# Patient Record
Sex: Male | Born: 1985 | Race: Black or African American | Hispanic: No | Marital: Single | Smoking: Current every day smoker
Health system: Southern US, Community
[De-identification: ages and names within clinical notes are randomized; demographics above are authoritative.]

## PROBLEM LIST (undated history)

## (undated) DIAGNOSIS — F410 Panic disorder [episodic paroxysmal anxiety] without agoraphobia: Secondary | ICD-10-CM

## (undated) DIAGNOSIS — G43909 Migraine, unspecified, not intractable, without status migrainosus: Secondary | ICD-10-CM

---

## 2005-01-12 ENCOUNTER — Inpatient Hospital Stay: Payer: Self-pay | Admitting: Psychiatry

## 2005-03-09 ENCOUNTER — Emergency Department: Payer: Self-pay | Admitting: Emergency Medicine

## 2005-04-03 ENCOUNTER — Emergency Department: Payer: Self-pay | Admitting: Emergency Medicine

## 2005-04-25 ENCOUNTER — Emergency Department: Payer: Self-pay | Admitting: Emergency Medicine

## 2005-08-01 ENCOUNTER — Emergency Department: Payer: Self-pay | Admitting: Emergency Medicine

## 2005-10-10 ENCOUNTER — Emergency Department: Payer: Self-pay | Admitting: Unknown Physician Specialty

## 2005-10-22 ENCOUNTER — Emergency Department: Payer: Self-pay | Admitting: Unknown Physician Specialty

## 2006-02-23 ENCOUNTER — Emergency Department: Payer: Self-pay | Admitting: Emergency Medicine

## 2006-12-19 ENCOUNTER — Emergency Department: Payer: Self-pay | Admitting: Emergency Medicine

## 2007-02-25 ENCOUNTER — Inpatient Hospital Stay: Payer: Self-pay | Admitting: Unknown Physician Specialty

## 2007-06-11 ENCOUNTER — Emergency Department: Payer: Self-pay | Admitting: Emergency Medicine

## 2007-07-13 ENCOUNTER — Emergency Department: Payer: Self-pay | Admitting: Emergency Medicine

## 2007-09-21 ENCOUNTER — Emergency Department: Payer: Self-pay | Admitting: Emergency Medicine

## 2008-05-22 ENCOUNTER — Emergency Department: Payer: Self-pay | Admitting: Emergency Medicine

## 2008-06-27 ENCOUNTER — Emergency Department: Payer: Self-pay | Admitting: Emergency Medicine

## 2008-06-29 ENCOUNTER — Inpatient Hospital Stay: Payer: Self-pay | Admitting: Unknown Physician Specialty

## 2008-08-07 ENCOUNTER — Emergency Department: Payer: Self-pay | Admitting: Emergency Medicine

## 2008-12-04 ENCOUNTER — Emergency Department: Payer: Self-pay | Admitting: Emergency Medicine

## 2008-12-05 ENCOUNTER — Emergency Department: Payer: Self-pay | Admitting: Emergency Medicine

## 2009-01-11 ENCOUNTER — Inpatient Hospital Stay: Payer: Self-pay | Admitting: Unknown Physician Specialty

## 2009-01-25 ENCOUNTER — Emergency Department: Payer: Self-pay | Admitting: Emergency Medicine

## 2009-02-26 ENCOUNTER — Inpatient Hospital Stay: Payer: Self-pay | Admitting: Psychiatry

## 2010-08-31 ENCOUNTER — Emergency Department: Payer: Self-pay | Admitting: Unknown Physician Specialty

## 2010-09-15 ENCOUNTER — Emergency Department: Payer: Self-pay | Admitting: Emergency Medicine

## 2010-09-16 ENCOUNTER — Ambulatory Visit: Payer: Self-pay | Admitting: Cardiovascular Disease

## 2011-04-08 ENCOUNTER — Emergency Department: Payer: Self-pay | Admitting: Internal Medicine

## 2011-05-24 ENCOUNTER — Emergency Department: Payer: Self-pay | Admitting: Emergency Medicine

## 2011-06-27 ENCOUNTER — Emergency Department: Payer: Self-pay | Admitting: Emergency Medicine

## 2011-07-09 ENCOUNTER — Emergency Department: Payer: Self-pay | Admitting: Emergency Medicine

## 2011-07-19 ENCOUNTER — Emergency Department: Payer: Self-pay | Admitting: Emergency Medicine

## 2013-01-12 ENCOUNTER — Emergency Department: Payer: Self-pay | Admitting: Emergency Medicine

## 2013-01-12 LAB — COMPREHENSIVE METABOLIC PANEL
Albumin: 4.5 g/dL (ref 3.4–5.0)
BUN: 12 mg/dL (ref 7–18)
Bilirubin,Total: 0.3 mg/dL (ref 0.2–1.0)
Chloride: 106 mmol/L (ref 98–107)
Creatinine: 1.02 mg/dL (ref 0.60–1.30)
EGFR (Non-African Amer.): >60
Potassium: 3.5 mmol/L (ref 3.5–5.1)
SGOT(AST): 47 U/L — ABNORMAL HIGH (ref 15–37)
SGPT (ALT): 38 U/L (ref 12–78)

## 2013-01-12 LAB — DRUG SCREEN, URINE
Barbiturates, Ur Screen: NEGATIVE (ref ?–200)
Cannabinoid 50 Ng, Ur ~~LOC~~: POSITIVE (ref ?–50)
MDMA (Ecstasy)Ur Screen: NEGATIVE (ref ?–500)
Methadone, Ur Screen: NEGATIVE (ref ?–300)
Opiate, Ur Screen: NEGATIVE (ref ?–300)
Phencyclidine (PCP) Ur S: NEGATIVE (ref ?–25)

## 2013-01-12 LAB — CBC
HGB: 15.3 g/dL (ref 13.0–18.0)
MCH: 27.6 pg (ref 26.0–34.0)
MCHC: 32.8 g/dL (ref 32.0–36.0)
MCV: 84 fL (ref 80–100)
RBC: 5.54 10*6/uL (ref 4.40–5.90)
RDW: 14.2 % (ref 11.5–14.5)
WBC: 7.7 10*3/uL (ref 3.8–10.6)

## 2013-01-12 LAB — ACETAMINOPHEN LEVEL: Acetaminophen: <2 ug/mL

## 2013-01-12 LAB — TSH: Thyroid Stimulating Horm: 2.14 u[IU]/mL

## 2013-01-12 LAB — ETHANOL
Ethanol %: 0.069 % (ref 0.000–0.080)
Ethanol: 69 mg/dL

## 2014-06-10 ENCOUNTER — Inpatient Hospital Stay: Payer: Self-pay | Admitting: Psychiatry

## 2014-06-10 LAB — URINALYSIS, COMPLETE
Bacteria: NONE SEEN
Bilirubin,UR: NEGATIVE
Blood: NEGATIVE
Glucose,UR: NEGATIVE mg/dL (ref 0–75)
Hyaline Cast: 81
Leukocyte Esterase: NEGATIVE
NITRITE: NEGATIVE
Ph: 5 (ref 4.5–8.0)
Protein: 100
SPECIFIC GRAVITY: 1.023 (ref 1.003–1.030)
SQUAMOUS EPITHELIAL: NONE SEEN

## 2014-06-10 LAB — CBC
HCT: 49.2 % (ref 40.0–52.0)
HGB: 15.4 g/dL (ref 13.0–18.0)
MCH: 26.5 pg (ref 26.0–34.0)
MCHC: 31.3 g/dL — AB (ref 32.0–36.0)
MCV: 85 fL (ref 80–100)
PLATELETS: 261 10*3/uL (ref 150–440)
RBC: 5.82 10*6/uL (ref 4.40–5.90)
RDW: 14.5 % (ref 11.5–14.5)
WBC: 4.7 10*3/uL (ref 3.8–10.6)

## 2014-06-10 LAB — COMPREHENSIVE METABOLIC PANEL
ALBUMIN: 4.1 g/dL (ref 3.4–5.0)
AST: 40 U/L — AB (ref 15–37)
Alkaline Phosphatase: 79 U/L
Anion Gap: 6 — ABNORMAL LOW (ref 7–16)
BILIRUBIN TOTAL: 0.6 mg/dL (ref 0.2–1.0)
BUN: 11 mg/dL (ref 7–18)
CO2: 25 mmol/L (ref 21–32)
Calcium, Total: 9.1 mg/dL (ref 8.5–10.1)
Chloride: 106 mmol/L (ref 98–107)
Creatinine: 1.31 mg/dL — ABNORMAL HIGH (ref 0.60–1.30)
EGFR (African American): >60
EGFR (Non-African Amer.): >60
Glucose: 122 mg/dL — ABNORMAL HIGH (ref 65–99)
Osmolality: 275 (ref 275–301)
POTASSIUM: 3.6 mmol/L (ref 3.5–5.1)
SGPT (ALT): 40 U/L (ref 12–78)
SODIUM: 137 mmol/L (ref 136–145)
Total Protein: 7.9 g/dL (ref 6.4–8.2)

## 2014-06-10 LAB — DRUG SCREEN, URINE
Amphetamines, Ur Screen: NEGATIVE (ref ?–1000)
BARBITURATES, UR SCREEN: NEGATIVE (ref ?–200)
Benzodiazepine, Ur Scrn: NEGATIVE (ref ?–200)
CANNABINOID 50 NG, UR ~~LOC~~: POSITIVE (ref ?–50)
COCAINE METABOLITE, UR ~~LOC~~: NEGATIVE (ref ?–300)
MDMA (Ecstasy)Ur Screen: NEGATIVE (ref ?–500)
METHADONE, UR SCREEN: NEGATIVE (ref ?–300)
OPIATE, UR SCREEN: NEGATIVE (ref ?–300)
PHENCYCLIDINE (PCP) UR S: NEGATIVE (ref ?–25)
Tricyclic, Ur Screen: NEGATIVE (ref ?–1000)

## 2014-06-10 LAB — ETHANOL
Ethanol %: <0.003 % (ref 0.000–0.080)
Ethanol: <3 mg/dL

## 2014-06-10 LAB — SALICYLATE LEVEL: SALICYLATES, SERUM: 2 mg/dL

## 2014-06-10 LAB — ACETAMINOPHEN LEVEL: Acetaminophen: <2 ug/mL

## 2014-10-11 LAB — CBC
HCT: 48.3 % (ref 40.0–52.0)
HGB: 15.4 g/dL (ref 13.0–18.0)
MCH: 27.1 pg (ref 26.0–34.0)
MCHC: 31.9 g/dL — AB (ref 32.0–36.0)
MCV: 85 fL (ref 80–100)
PLATELETS: 242 10*3/uL (ref 150–440)
RBC: 5.68 10*6/uL (ref 4.40–5.90)
RDW: 14.5 % (ref 11.5–14.5)
WBC: 5.3 10*3/uL (ref 3.8–10.6)

## 2014-10-11 LAB — SALICYLATE LEVEL: Salicylates, Serum: <1.7 mg/dL

## 2014-10-11 LAB — COMPREHENSIVE METABOLIC PANEL
ALK PHOS: 82 U/L
ANION GAP: 10 (ref 7–16)
Albumin: 3.8 g/dL (ref 3.4–5.0)
BILIRUBIN TOTAL: 0.3 mg/dL (ref 0.2–1.0)
BUN: 12 mg/dL (ref 7–18)
CHLORIDE: 111 mmol/L — AB (ref 98–107)
CO2: 23 mmol/L (ref 21–32)
Calcium, Total: 8 mg/dL — ABNORMAL LOW (ref 8.5–10.1)
Creatinine: 1.11 mg/dL (ref 0.60–1.30)
EGFR (African American): >60
EGFR (Non-African Amer.): >60
GLUCOSE: 135 mg/dL — AB (ref 65–99)
OSMOLALITY: 289 (ref 275–301)
POTASSIUM: 3.6 mmol/L (ref 3.5–5.1)
SGOT(AST): 36 U/L (ref 15–37)
SGPT (ALT): 38 U/L
Sodium: 144 mmol/L (ref 136–145)
Total Protein: 7.5 g/dL (ref 6.4–8.2)

## 2014-10-11 LAB — ACETAMINOPHEN LEVEL: ACETAMINOPHEN: 17 ug/mL

## 2014-10-11 LAB — ETHANOL: ETHANOL LVL: 70 mg/dL

## 2014-10-12 LAB — DRUG SCREEN, URINE
Amphetamines, Ur Screen: NEGATIVE (ref ?–1000)
BARBITURATES, UR SCREEN: NEGATIVE (ref ?–200)
Benzodiazepine, Ur Scrn: NEGATIVE (ref ?–200)
Cannabinoid 50 Ng, Ur ~~LOC~~: POSITIVE (ref ?–50)
Cocaine Metabolite,Ur ~~LOC~~: NEGATIVE (ref ?–300)
MDMA (Ecstasy)Ur Screen: NEGATIVE (ref ?–500)
METHADONE, UR SCREEN: NEGATIVE (ref ?–300)
Opiate, Ur Screen: NEGATIVE (ref ?–300)
PHENCYCLIDINE (PCP) UR S: NEGATIVE (ref ?–25)
TRICYCLIC, UR SCREEN: NEGATIVE (ref ?–1000)

## 2014-10-12 LAB — URINALYSIS, COMPLETE
BILIRUBIN, UR: NEGATIVE
Bacteria: NONE SEEN
Blood: NEGATIVE
GLUCOSE, UR: NEGATIVE mg/dL (ref 0–75)
Hyaline Cast: 3
Ketone: NEGATIVE
Leukocyte Esterase: NEGATIVE
Nitrite: NEGATIVE
PH: 5 (ref 4.5–8.0)
Protein: NEGATIVE
SPECIFIC GRAVITY: 1.025 (ref 1.003–1.030)
Squamous Epithelial: NONE SEEN
WBC UR: 2 /HPF (ref 0–5)

## 2014-10-13 ENCOUNTER — Inpatient Hospital Stay: Payer: Self-pay | Admitting: Psychiatry
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
## 2015-04-27 NOTE — H&P (Signed)
PATIENT NAME:  Dylan Cook, Stone L [id number] OF BIRTH:  05-Jul-1986 OF ASSESSMENT:  10/14/2014  FOR CONSULTATION: The patient is a 29 year old African American male with history of multiple psychiatric hospitalizations presented to the ER on IVC papers which were initiated by his father after he was having verbal altercation with his father.  "Me and my dad were arguing and he was getting in my face for no reason." OF PRESENT ILLNESS:  Per ER assessment: The patient reported that he was IVC' d by his father.  Reported that he currently lives with his parents.  The patient reported that he was passing by his father and did not talk to him.  His father became upset and they started arguing.  He started threatening his father and then the father also threatened him that he is going to shoot him. The patient reported that were verbally arguing and they did not have any physical alternations.  Alcohol level was 70 at arrival to ER. The patient reported that he was brought to the hospital because he has not been compliant with his medication and has stopped taking his medications almost a month ago. He reported that his car had broken down and he did not have a way to go to his outpatient appointments with Dr. Marguerite OleaMoffett.  He reported that he has history of schizoaffective disorder with history of violent outbursts in the past. He reported that he has not been sleeping well, as well. The patient currently denied having any suicidal ideations or plans. He reported that the police were quick in making him to the hospital, although his father also threatened him. He currently denied having any suicidal or homicidal ideations or plans.   the patient denies having problems with mood, appetite, energy or concentration.  He denies having SI, HI or A/V H. Denies having SE from medications and denies having any physical complaints. RHA: pt currently receiving CST.  Long h/o of anger issues, also the issues with his father are chronic.   Mother wants the pt to return to the house once ready for discharge. ABUSE HISTORY: The patient reported that he had been smoking marijuana over the weekend.  He reported that he does not drink alcohol on a regular basis.  He currently denied using illicit drugs, including cocaine and opiates.   PSYCHIATRIC HISTORY: The patient has long history of mental health issues and has multiple psychiatric hospitalizations. He was recently discharged in June 2015. He has history of aggressive behavior in the past and has been diagnosed with schizoaffective disorder and has been tried on several psychotropic medications, including Depakote, lithium and antipsychotics. He currently denied having any suicidal thoughts. reports he has been taking the same medications he was discharge on back in June.  Seroquel, klonopin and Depakote which he feels are helpful.  MEDICAL HISTORY: hypertension not on medications.  HISTORY: The patient has history of bipolar disorder.  HISTORY: The patient reported that he currently lives with his family. He has some pending legal charges related to domestic violence in the past. The patient reported that he does not have any steady relationship at this time.   MEDICATIONS: None reported as he has been noncompliant with his medication.   ALLERGIES: LITHIUM AND CLONAZEPAM. OF SYSTEMS:  Denies any fever or fatigue, weakness. No hearing loss, epistaxis discharge. No double or blurred vision. No cough, wheezing or hemoptysis. Denies any chest pain or orthopnea. No nausea, vomiting, diarrhea, abdominal pain. No dysuria or hematuria noted. No polydipsia, no CVA noted.  No anemia or easy bruising. No acne or rash. No numbness or weakness noted.   MENTAL STATUS EXAMINATION: aa male who appears his stated agepleasant and cooperativeactivity: mildly decreasedcontact: wnlregular tone, volume and rateprocess: linearcontent: neg for SI, HI, or A/V H. dysphoric (MILDLY)congruentfairfairexam:and  oriented times 4of knowledge is averageand concentration: grossly intact.    EXAM:SIGNS: Temperature 98.7, pulse 79, respirations 18, blood pressure 143/97.nl gait, nl motor tone and no evidence of involuntary movements. DATA:  Glucose 135, BUN 12, creatinine 1.11, sodium 144, potassium 3.6, chloride 111, anion gap 10, osmolality 289, calcium 8.0. Blood alcohol level 70. Protein 7.5, albumin 3.8, bilirubin 0.3, alkaline phosphatase 82, AST 36, ALT 38, urine drug screen is positive for cannabinoids.  WBC is 5.3, RBC 5.68, hemoglobin is 15.4, hematocrit is 48.3, MCV 85, RDW 14.5.   IMPRESSION: I: Schizoaffective disorder, bipolar type, Alcohol use disorder moderate, Cannabis use disorder severe.II: antisocial traits by history III: Hypertension 29 y/o male with h/o schizoaffective disorder who was brought in under petition due to having an argument and communicating threats to his father.  Pt had an alcohol level of 70.  He has not been compliant with psychotropics for the last month.  for aggression and non compliance d/oSeroquel 50 mg po q am and 100 mg po qhsDepakote 250 mg po bid d/c klonopin as pt has not used in 1 m.  elevated BP will monitor closely to see if need of adding antihypertensive. useevidence of withdrawalprior h/o complicated withdrawals planning:observe for 24 hlikely will d/c tomorrow to f/u with [facility] services   Electronic Signatures: Jimmy FootmanHernandez-Gonzalez, Lamika Connolly (MD)  (Signed on 28-Oct-15 13:33)  Authored  Last Updated: 28-Oct-15 13:33 by Jimmy FootmanHernandez-Gonzalez, Yaron Grasse (MD)

## 2017-09-13 ENCOUNTER — Emergency Department
Admission: EM | Admit: 2017-09-13 | Discharge: 2017-09-14 | Disposition: A | Discharge status: Home / Self Care | Payer: Medicare Other | Attending: Emergency Medicine | Admitting: Emergency Medicine

## 2017-09-13 DIAGNOSIS — T50901A Poisoning by unspecified drugs, medicaments and biological substances, accidental (unintentional), initial encounter: Secondary | ICD-10-CM

## 2017-09-13 DIAGNOSIS — T43201A Poisoning by unspecified antidepressants, accidental (unintentional), initial encounter: Secondary | ICD-10-CM | POA: Diagnosis not present

## 2017-09-14 DIAGNOSIS — T43201A Poisoning by unspecified antidepressants, accidental (unintentional), initial encounter: Secondary | ICD-10-CM | POA: Diagnosis not present

## 2017-09-14 LAB — COMPREHENSIVE METABOLIC PANEL
ALBUMIN: 4.3 g/dL (ref 3.5–5.0)
ALK PHOS: 70 U/L (ref 38–126)
ALT: 36 U/L (ref 17–63)
ANION GAP: 9 (ref 5–15)
AST: 35 U/L (ref 15–41)
BUN: 11 mg/dL (ref 6–20)
CALCIUM: 8.9 mg/dL (ref 8.9–10.3)
CHLORIDE: 106 mmol/L (ref 101–111)
CO2: 24 mmol/L (ref 22–32)
CREATININE: 1.21 mg/dL (ref 0.61–1.24)
GFR calc non Af Amer: >60 mL/min (ref >60)
GLUCOSE: 97 mg/dL (ref 65–99)
Potassium: 3.6 mmol/L (ref 3.5–5.1)
SODIUM: 139 mmol/L (ref 135–145)
Total Bilirubin: 0.6 mg/dL (ref 0.3–1.2)
Total Protein: 7.6 g/dL (ref 6.5–8.1)

## 2017-09-14 LAB — CBC
HCT: 45.6 % (ref 40.0–52.0)
HEMOGLOBIN: 15.4 g/dL (ref 13.0–18.0)
MCH: 27.6 pg (ref 26.0–34.0)
MCHC: 33.8 g/dL (ref 32.0–36.0)
MCV: 81.7 fL (ref 80.0–100.0)
Platelets: 258 10*3/uL (ref 150–440)
RBC: 5.58 MIL/uL (ref 4.40–5.90)
RDW: 14.5 % (ref 11.5–14.5)
WBC: 5.7 10*3/uL (ref 3.8–10.6)

## 2017-09-14 LAB — ACETAMINOPHEN LEVEL

## 2017-09-14 LAB — SALICYLATE LEVEL: Salicylate Lvl: <7 mg/dL (ref 2.8–30.0)

## 2017-09-14 LAB — ETHANOL

## 2017-09-14 MED ORDER — SODIUM CHLORIDE 0.9 % IV BOLUS (SEPSIS)
1000.0000 mL | Freq: Once | INTRAVENOUS | Status: AC
  Administered 2017-09-14: 1000 mL via INTRAVENOUS

## 2017-09-14 NOTE — ED Notes (Signed)
Resting quietly at this time.  Mother at bedside.

## 2017-09-14 NOTE — ED Provider Notes (Addendum)
[facility] Emergency Department Provider Note  ____________________________________________  Time seen: Approximately 12:30 AM  I have reviewed the triage vital signs and the nursing notes.   HISTORY  Chief Complaint Drug Overdose   HPI Dylan Cook is a 31 y.o. male the history of bipolar disorder who presents after an accidental overdose of amitriptyline. Patient reports that he was given a prescription of amitriptyline many years ago for his depression. The prescription is old and expired and hasn't used in several years. He has been having a lot of personal problems with his father and his partner for the last few weeks. He hasn't slept in 2 days and has been feeling more overwhelmed. He is suppose to go to work tomorrow at [employer] and needed to sleep so he took one 100 mg amitriptyline pill to helphim sleep and a few hours later he took another one. He tells me he forgot that he had taken one pill this evening and he panicked that he might have taken too much and called EMS. He also took two alcoholic drinks this evening. He smokes MJ but denies other drugs. Denies prior h/o suicide attempt.  patient denies suicidal ideation. He denies homicidal ideation. Denies hallucinations. He denies chest pain, shortness of breath, nausea, vomiting, palpitations, diarrhea, fever or chills.  No past medical history on file.  There are no active problems to display for this patient.   No past surgical history on file.  Prior to Admission medications   Not on File    Allergies Klonopin [clonazepam] and Lithium  No family history on file.  Social History Social History  Substance Use Topics  . Smoking status: Not on file  . Smokeless tobacco: Not on file  . Alcohol use Not on file    Review of Systems  Constitutional: Negative for fever. Eyes: Negative for visual changes. ENT: Negative for sore throat. Neck: No neck pain  Cardiovascular: Negative  for chest pain. Respiratory: Negative for shortness of breath. Gastrointestinal: Negative for abdominal pain, vomiting or diarrhea. Genitourinary: Negative for dysuria. Musculoskeletal: Negative for back pain. Skin: Negative for rash. Neurological: Negative for headaches, weakness or numbness. Psych: No SI or HI. + depression  ____________________________________________   PHYSICAL EXAM:  VITAL SIGNS: ED Triage Vitals  Enc Vitals Group     BP 09/13/17 2358 (!) 161/93     Pulse Rate 09/13/17 2358 (!) 102     Resp 09/13/17 2358 20     Temp 09/13/17 2358 98 F (36.7 C)     Temp Source 09/13/17 2358 Oral     SpO2 09/13/17 2358 96 %     Weight 09/13/17 2359 245 lb (111.1 kg)     Height 09/13/17 2359  (1.753 m)     Head Circumference --      Peak Flow --      Pain Score --      Pain Loc --      Pain Edu? --      Excl. in GC? --     Constitutional: Alert and oriented. Well appearing and in no apparent distress. HEENT:      Head: Normocephalic and atraumatic.         Eyes: Conjunctivae are normal. Sclera is non-icteric.       Mouth/Throat: Mucous membranes are moist.       Neck: Supple with no signs of meningismus. Cardiovascular: Tachycardic with regular rhythm. No murmurs, gallops, or rubs. 2+ symmetrical distal pulses are  present in all extremities. No JVD. Respiratory: Normal respiratory effort. Lungs are clear to auscultation bilaterally. No wheezes, crackles, or rhonchi.  Gastrointestinal: Soft, non tender, and non distended with positive bowel sounds. No rebound or guarding. Genitourinary: No CVA tenderness. Musculoskeletal: Nontender with normal range of motion in all extremities. No edema, cyanosis, or erythema of extremities. Neurologic: Normal speech and language. Face is symmetric. Moving all extremities. No gross focal neurologic deficits are appreciated. Skin: Skin is warm, dry and intact. No rash noted. Psychiatric: Mood and affect are normal. Speech and  behavior are normal.  ____________________________________________   LABS (all labs ordered are listed, but only abnormal results are displayed)  Labs Reviewed  ACETAMINOPHEN LEVEL - Abnormal; Notable for the following:       Result Value   Acetaminophen (Tylenol), Serum <10 (*)    All other components within normal limits  CBC  COMPREHENSIVE METABOLIC PANEL  SALICYLATE LEVEL  ETHANOL  URINE DRUG SCREEN, QUALITATIVE ([facility] ONLY)   ____________________________________________  EKG  ED ECG REPORT I, Nita Sickle, the attending physician, personally viewed and interpreted this ECG.  Sinus tachycardia, rate of 102, normal intervals, normal axis, no ST elevations or depressions. ____________________________________________  RADIOLOGY  none  ____________________________________________   PROCEDURES  Procedure(s) performed: None Procedures Critical Care performed:  None ____________________________________________   INITIAL IMPRESSION / ASSESSMENT AND PLAN / ED COURSE   31 y.o. male the history of bipolar disorder who presents after an accidental overdose of amitriptyline after taking 2 x  amitriptyline pills this evening. Patient has no complaints at this time. Slightly tachycardic but otherwise normal vital signs. We'll do EKG, blood work, salicylate and Tylenol level, drug screen, ethanol level. Will monitor on telemetry and consult Poison Control Center. I offered a psych evaluation for patient for help with his depression and insomnia but patient wishes medical clearance and dc since he has to work tomorrow. Patient does not meet Criteria for involuntary commitment since this was an intentional ingestion with no suicidality.  Clinical Course as of Sep 14 201  Fri Sep 14, 2017  0158 discussed with [facility] who recommended discharge home after blood work was done. No need for observation since patient's total dose of medicine is within therapeutic  range and not considered an overdose. Labs with no acute findings. EKG unchanged from prior. Patient received  IV fluids. patient remains well appearing and is going to be discharged home at this time.  [CV]    Clinical Course User Index [CV] Don Perking [place], MD    _________________________ 2:04 AM on 09/14/2017 -----------------------------------------  outpatient mental health resources provided to patient and his mother who is at the bedside. Both comfortable going home. Mother has no concerns about patient's safety  Pertinent labs & imaging results that were available during my care of the patient were reviewed by me and considered in my medical decision making (see chart for details).    ____________________________________________   FINAL CLINICAL IMPRESSION(S) / ED DIAGNOSES  Final diagnoses:  Accidental drug overdose, initial encounter      NEW MEDICATIONS STARTED DURING THIS VISIT:  New Prescriptions   No medications on file     Note:  This document was prepared using Dragon voice recognition software and may include unintentional dictation errors.    Don Perking, [place], MD 09/14/17 Therisa Doyne    Don Perking, [place], MD 09/14/17 [phone]

## 2017-09-14 NOTE — ED Triage Notes (Signed)
Patient to [facility] 2 via EMS from home after taking extra dose amitriptyline tonight.  Patient reports that the prescription is 32 years old and that he forgot he had taken one tablet then took another.  Patient reports that is has been having a lot of stress lately, arguing with his parents and girlfriend.  Patient reports he has not slept in 2 days.  Patient also reports having suicidal thoughts, but states "I would never act on it".  Patient contracts for safety.

## 2017-09-14 NOTE — Discharge Instructions (Signed)
You have been seen in the Emergency Department (ED)  today for a psychiatric complaint.  You have been evaluated by psychiatry and we believe you are safe to be discharged from the hospital.   ° °Please return to the Emergency Department (ED)  immediately if you have ANY thoughts of hurting yourself or anyone else, so that we may help you. ° °Please avoid alcohol and drug use. ° °Follow up with your doctor and/or therapist as soon as possible regarding today's ED  visit.  ° °You may call crisis hotline for [place] at [phone]. ° °

## 2018-05-15 ENCOUNTER — Other Ambulatory Visit: Payer: Self-pay

## 2018-05-15 ENCOUNTER — Emergency Department: Payer: Medicare Other

## 2018-05-15 DIAGNOSIS — G43809 Other migraine, not intractable, without status migrainosus: Secondary | ICD-10-CM | POA: Insufficient documentation

## 2018-05-15 DIAGNOSIS — F17200 Nicotine dependence, unspecified, uncomplicated: Secondary | ICD-10-CM | POA: Insufficient documentation

## 2018-05-15 DIAGNOSIS — R51 Headache: Secondary | ICD-10-CM | POA: Diagnosis present

## 2018-05-15 NOTE — ED Triage Notes (Addendum)
Pt arrives to ED via POV from home with c/o headache x 2hrs. Pt reports smoking marijuana prior to headache starting, with h/x of migraines. No reports of N/V; no SHOB or CP. No visual changes, no neuro deficits; no reports of unilateral weakness or numbness. Pt is very anxious, but otherwise in NAD. Pt reports taking an OTC BC powder PTA but unsure of exact time.

## 2018-05-16 ENCOUNTER — Emergency Department
Admission: EM | Admit: 2018-05-16 | Discharge: 2018-05-16 | Disposition: A | Discharge status: Home / Self Care | Payer: Medicare Other | Attending: Emergency Medicine | Admitting: Emergency Medicine

## 2018-05-16 DIAGNOSIS — G43809 Other migraine, not intractable, without status migrainosus: Secondary | ICD-10-CM

## 2018-05-16 HISTORY — DX: Panic disorder (episodic paroxysmal anxiety): F41.0

## 2018-05-16 HISTORY — DX: Migraine, unspecified, not intractable, without status migrainosus: G43.909

## 2018-05-16 LAB — URINALYSIS, COMPLETE (UACMP) WITH MICROSCOPIC
BILIRUBIN URINE: NEGATIVE
Bacteria, UA: NONE SEEN
Glucose, UA: NEGATIVE mg/dL
HGB URINE DIPSTICK: NEGATIVE
KETONES UR: NEGATIVE mg/dL
LEUKOCYTES UA: NEGATIVE
NITRITE: NEGATIVE
PROTEIN: NEGATIVE mg/dL
Specific Gravity, Urine: 1.014 (ref 1.005–1.030)
Squamous Epithelial / LPF: NONE SEEN (ref 0–5)
pH: 5 (ref 5.0–8.0)

## 2018-05-16 NOTE — ED Notes (Signed)
Pt stated having a migraine that "comes and goes". Hurts most on right side. Pt alert and oriented x 4.

## 2018-05-17 NOTE — ED Provider Notes (Signed)
[facility] Emergency Department Provider Note    First MD Initiated Contact with Patient 05/16/18 0114     (approximate)  I have reviewed the triage vital signs and the nursing notes.   HISTORY  Chief Complaint Headache    HPI Dylan Cook is a 32 y.o. male with history of panic attacks and migraines presents to the emergency department with a complaint of a headache which began 2 hours before arrival.  Patient states headache was generalized.  Patient denies any weakness no numbness gait instability or visual changes.  Patient admits to smoking marijuana before the onset of the headache.  Patient states that he took a over-the-counter BC powder prior to arrival.  Patient states no headache at this time.   Past Medical History:  Diagnosis Date  . Migraines   . Panic attacks     There are no active problems to display for this patient.   Past surgical history None  Prior to Admission medications   Not on File    Allergies Klonopin [clonazepam] and Lithium  No family history on file.  Social History Social History   Tobacco Use  . Smoking status: Current Every Day Smoker  . Smokeless tobacco: Never Used  Substance Use Topics  . Alcohol use: Not on file  . Drug use: Yes    Types: Marijuana    Review of Systems Constitutional: No fever/chills Eyes: No visual changes. ENT: No sore throat. Cardiovascular: Denies chest pain. Respiratory: Denies shortness of breath. Gastrointestinal: No abdominal pain.  No nausea, no vomiting.  No diarrhea.  No constipation. Genitourinary: Negative for dysuria. Musculoskeletal: Negative for neck pain.  Negative for back pain. Integumentary: Negative for rash. Neurological: Positive for headaches, negative for focal weakness or numbness.   ____________________________________________   PHYSICAL EXAM:  VITAL SIGNS: ED Triage Vitals  Enc Vitals Group     BP 05/15/18 2142 (!) 154/126   Pulse Rate 05/15/18 2142 (!) 122     Resp 05/15/18 2142 18     Temp 05/15/18 2142 98.3 F (36.8 C)     Temp Source 05/15/18 2142 Oral     SpO2 05/15/18 2142 99 %     Weight 05/15/18 2138 104.3 kg (230 lb)     Height 05/15/18 2138 1.778 m ( )     Head Circumference --      Peak Flow --      Pain Score 05/15/18 2138 8     Pain Loc --      Pain Edu? --      Excl. in GC? --     Constitutional: Alert and oriented. Well appearing and in no acute distress. Eyes: Conjunctivae are normal. PERRL. EOMI. Head: Atraumatic. Mouth/Throat: Mucous membranes are moist.  Oropharynx non-erythematous. Neck: No stridor.  No meningeal signs.   Cardiovascular: Normal rate, regular rhythm. Good peripheral circulation. Grossly normal heart sounds. Respiratory: Normal respiratory effort.  No retractions. Lungs CTAB. Gastrointestinal: Soft and nontender. No distention.  Musculoskeletal: No lower extremity tenderness nor edema. No gross deformities of extremities. Neurologic:  Normal speech and language. No gross focal neurologic deficits are appreciated.  Skin:  Skin is warm, dry and intact. No rash noted. Psychiatric: Mood and affect are normal. Speech and behavior are normal.  ____________________________________________   LABS (all labs ordered are listed, but only abnormal results are displayed)  Labs Reviewed  URINALYSIS, COMPLETE (UACMP) WITH MICROSCOPIC - Abnormal; Notable for the following components:      Result  Value   Color, Urine YELLOW (*)    APPearance CLEAR (*)    All other components within normal limits     RADIOLOGY I, Erick N Mikaelyn Arthurs, personally viewed and evaluated these images (plain radiographs) as part of my medical decision making, as well as reviewing the written report by the radiologist.  ED MD interpretation: CT scan of the head revealed no acute intracranial findings per radiologist  Official radiology report(s): No results  found.     Procedures   ____________________________________________   INITIAL IMPRESSION / ASSESSMENT AND PLAN / ED COURSE  As part of my medical decision making, I reviewed the following data within the electronic MEDICAL RECORD NUMBER  32 year old male presented with above-stated history and physical exam secondary to headache smoking marijuana.  CT scan was ordered before my evaluation of the patient which was unremarkable.  He has no headache at present ____________________________________________  FINAL CLINICAL IMPRESSION(S) / ED DIAGNOSES  Final diagnoses:  Other migraine without status migrainosus, not intractable     MEDICATIONS GIVEN DURING THIS VISIT:  Medications - No data to display   ED Discharge Orders    None       Note:  This document was prepared using Dragon voice recognition software and may include unintentional dictation errors.    Darci Current, MD 05/17/18 2240

## 2019-12-20 IMAGING — CT CT HEAD W/O CM
3 series · 16 of 47 positions shown, 19 images · non-contrast
Comparison: None.

CLINICAL DATA: Worst headache of the life. Ringing in the ears.
Increased blood pressure.

EXAM:
CT HEAD WITHOUT CONTRAST
TECHNIQUE: Contiguous axial images were obtained from the base of the skull
through the vertex without intravenous contrast.

[Series 3: head wo · axial · 0.46mm/px · z∈[+738,+868]mm · 10 of 32 slices shown, 13 images]
[im 3/32  brain]
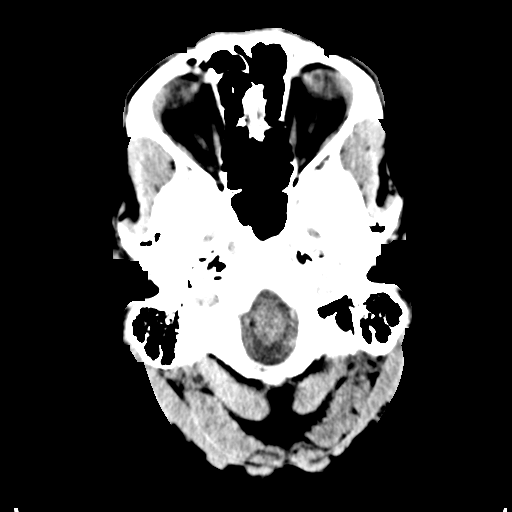
[im 3/32  bone]
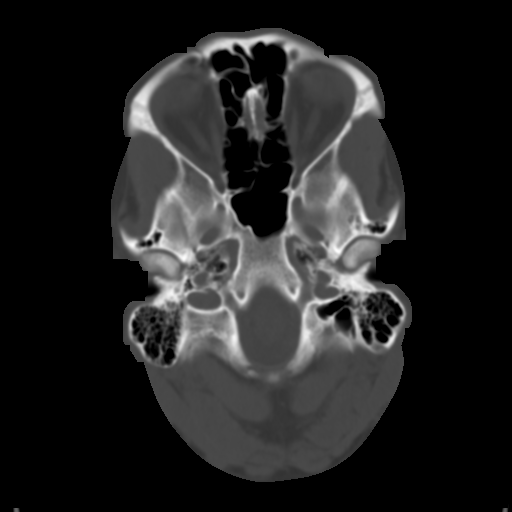
[im 6/32  brain]
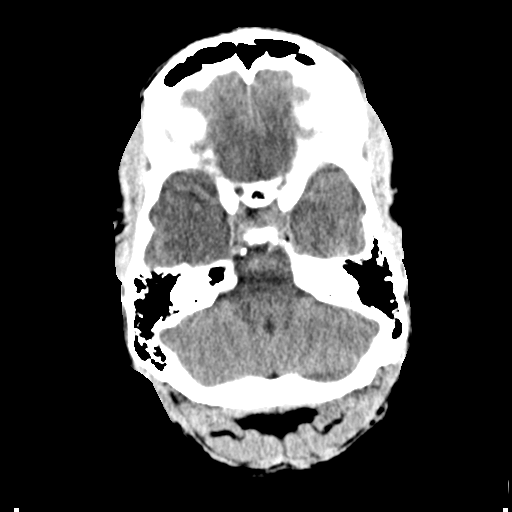
[im 9/32  brain]
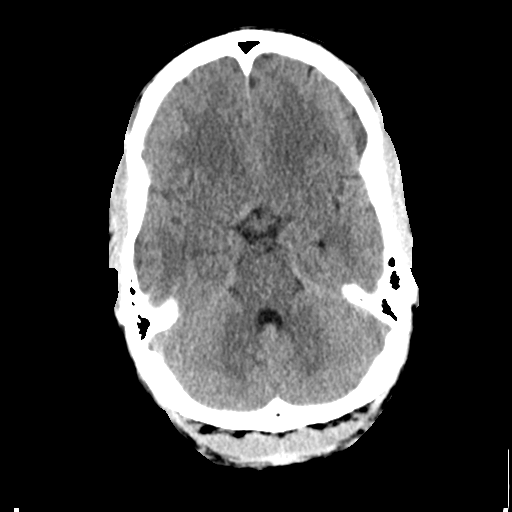
[im 11/32  brain]
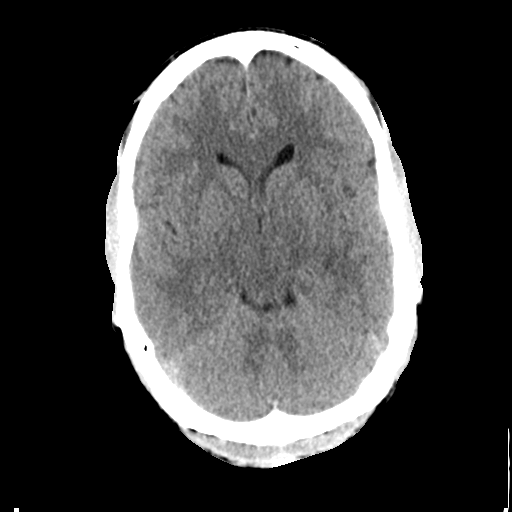
[im 14/32  brain]
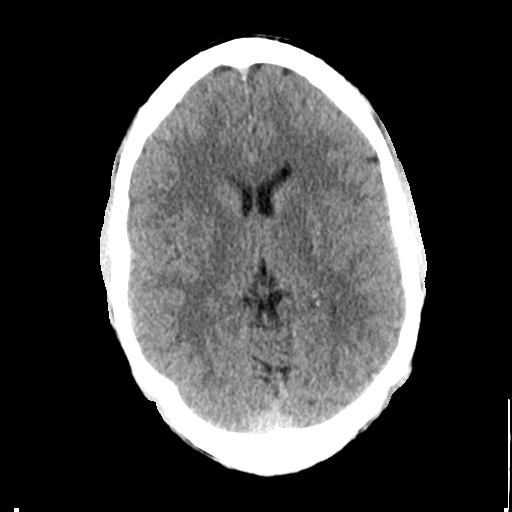
[im 14/32  bone]
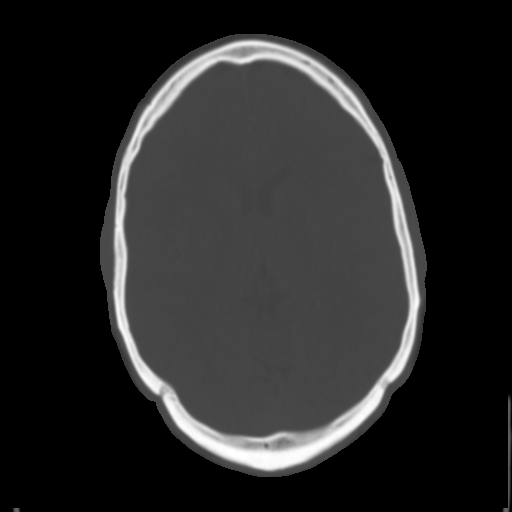
[im 18/32  brain]
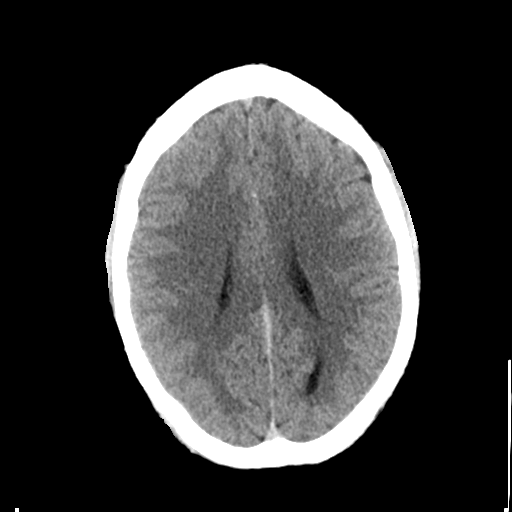
[im 21/32  brain]
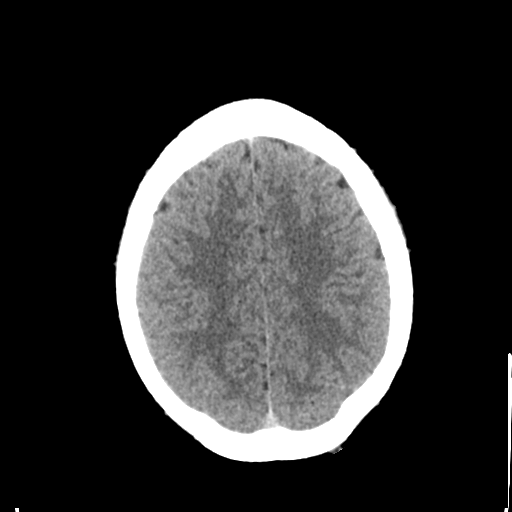
[im 24/32  brain]
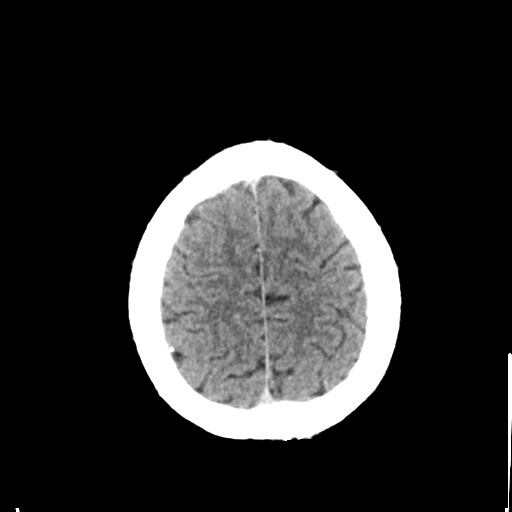
[im 26/32  brain]
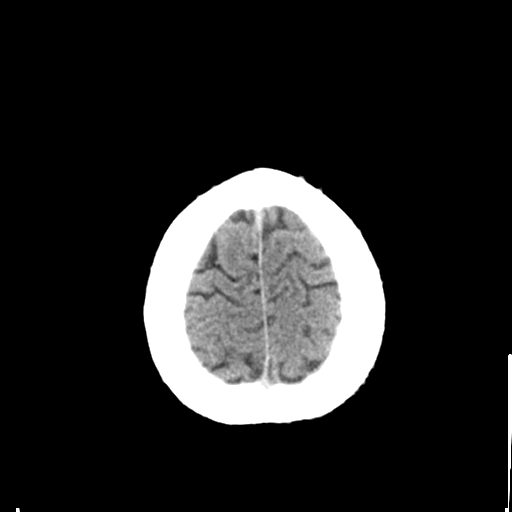
[im 26/32  bone]
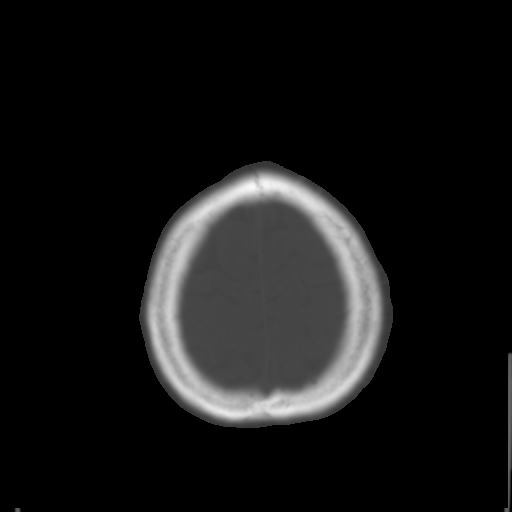
[im 29/32  brain]
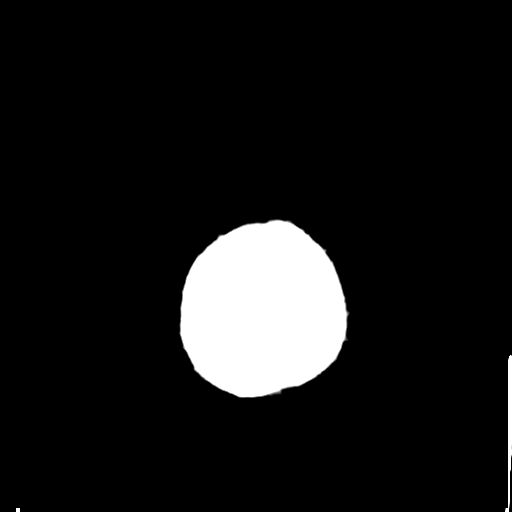

[Series 4: coronal soft tissue · coronal · 0.32mm/px · 3 of 68 slices shown]
[im 23/68  brain]
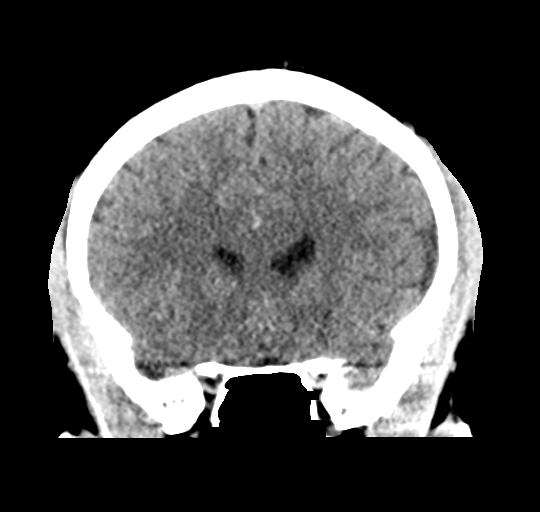
[im 30/68  brain]
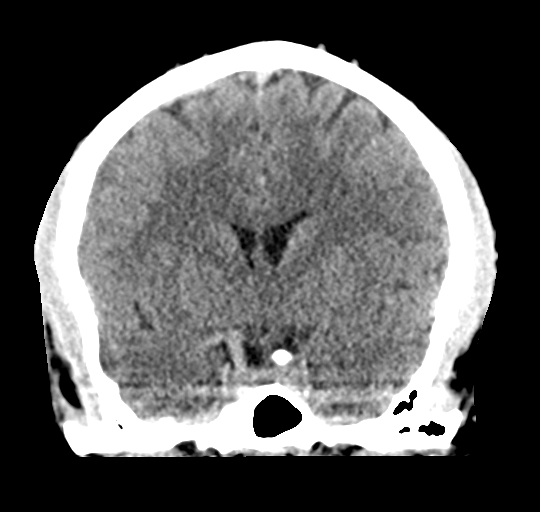
[im 38/68  brain]
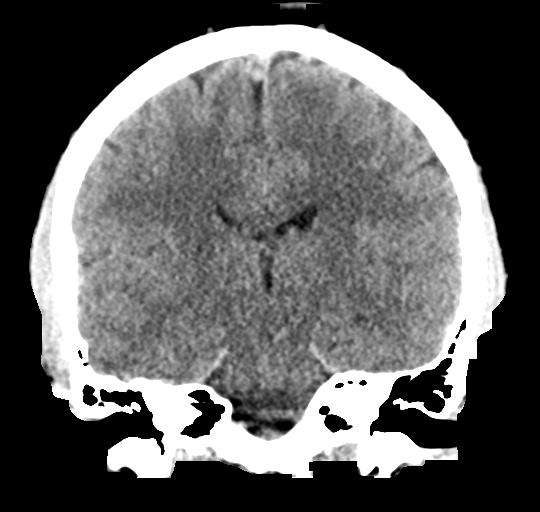

[Series 5: sagittal soft tissue · sagittal · 0.34mm/px · 3 of 52 slices shown]
[im 18/52  brain]
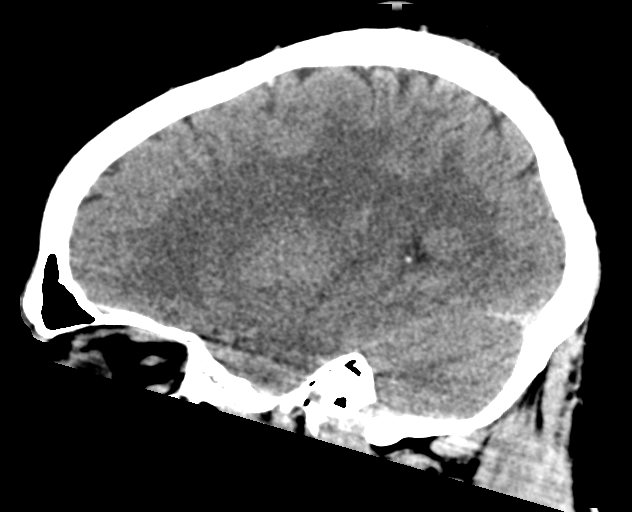
[im 26/52  brain]
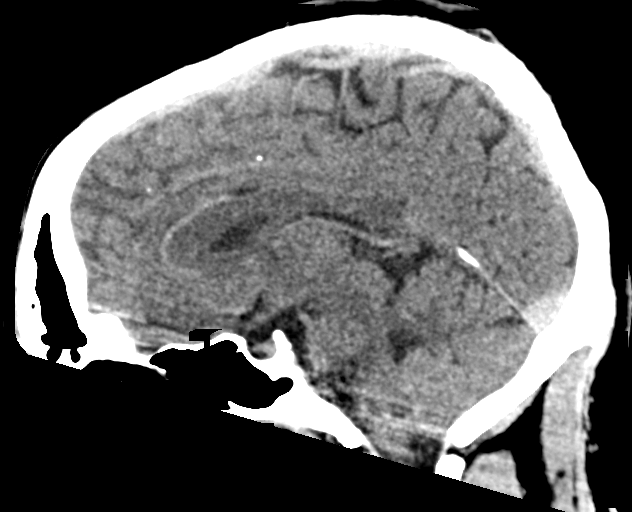
[im 35/52  brain]
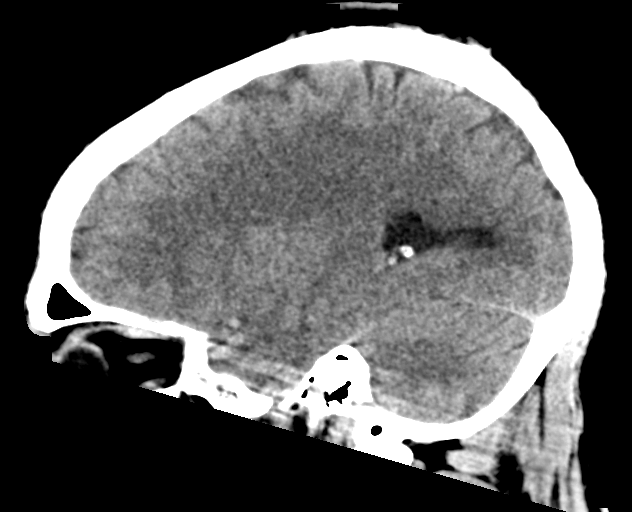

[16 of 47 positions shown; findings below may reference images not displayed]

FINDINGS: Brain: No evidence of acute infarction, hemorrhage, hydrocephalus,
extra-axial collection or mass lesion/mass effect.

Vascular: No hyperdense vessel or unexpected calcification.

Skull: No osseous abnormality.

Sinuses/Orbits: Visualized paranasal sinuses are clear. Visualized
mastoid sinuses are clear. Visualized orbits demonstrate no focal
abnormality.

Other: None
IMPRESSION: No acute intracranial pathology.

## 2021-09-04 ENCOUNTER — Other Ambulatory Visit: Payer: Self-pay

## 2021-09-04 DIAGNOSIS — F17200 Nicotine dependence, unspecified, uncomplicated: Secondary | ICD-10-CM | POA: Diagnosis not present

## 2021-09-04 DIAGNOSIS — Z202 Contact with and (suspected) exposure to infections with a predominantly sexual mode of transmission: Secondary | ICD-10-CM | POA: Insufficient documentation

## 2021-09-04 DIAGNOSIS — R300 Dysuria: Secondary | ICD-10-CM | POA: Insufficient documentation

## 2021-09-04 LAB — URINALYSIS, COMPLETE (UACMP) WITH MICROSCOPIC
Bacteria, UA: NONE SEEN
Bilirubin Urine: NEGATIVE
Glucose, UA: NEGATIVE mg/dL
Ketones, ur: NEGATIVE mg/dL
Leukocytes,Ua: NEGATIVE
Nitrite: NEGATIVE
Protein, ur: NEGATIVE mg/dL
Specific Gravity, Urine: 1.025 (ref 1.005–1.030)
pH: 6 (ref 5.0–8.0)

## 2021-09-04 NOTE — ED Triage Notes (Signed)
Pt states painful urination with penile drainage that began last week. Pt denies back pain, fever.

## 2021-09-05 ENCOUNTER — Emergency Department
Admission: EM | Admit: 2021-09-05 | Discharge: 2021-09-05 | Disposition: A | Discharge status: Home / Self Care | Payer: Medicare Other | Attending: Emergency Medicine | Admitting: Emergency Medicine

## 2021-09-05 DIAGNOSIS — Z711 Person with feared health complaint in whom no diagnosis is made: Secondary | ICD-10-CM

## 2021-09-05 DIAGNOSIS — R300 Dysuria: Secondary | ICD-10-CM

## 2021-09-05 LAB — URINE CULTURE: Culture: NO GROWTH

## 2021-09-05 LAB — CHLAMYDIA/NGC RT PCR (ARMC ONLY)
Chlamydia Tr: NOT DETECTED
N gonorrhoeae: NOT DETECTED

## 2021-09-05 MED ORDER — DOXYCYCLINE HYCLATE 100 MG PO TABS
100.0000 mg | ORAL_TABLET | Freq: Once | ORAL | Status: AC
  Administered 2021-09-05: 100 mg via ORAL
  Filled 2021-09-05: qty 1

## 2021-09-05 MED ORDER — CEFTRIAXONE SODIUM 1 G IJ SOLR
500.0000 mg | Freq: Once | INTRAMUSCULAR | Status: AC
  Administered 2021-09-05: 500 mg via INTRAMUSCULAR
  Filled 2021-09-05: qty 10

## 2021-09-05 MED ORDER — DOXYCYCLINE HYCLATE 100 MG PO CAPS: 100.0000 mg | ORAL_CAPSULE | Freq: Two times a day (BID) | ORAL | 0 refills | Status: AC

## 2021-09-05 NOTE — ED Provider Notes (Signed)
[facility] Emergency Department Provider Note  ____________________________________________  Time seen: Approximately 2:42 AM  I have reviewed the triage vital signs and the nursing notes.   HISTORY  Chief Complaint Dysuria   HPI Dylan Cook is a 35 y.o. male who presents for evaluation of dysuria.  Patient reports that his symptoms have been ongoing for a week.  Denies abdominal pain, flank pain, fever, chills, scrotum pain.  Denies any prior history of STD.  Patient was last sexually active a month ago.  When asked if patient has noticed any penile discharge his answer is "I do not know as I try not to look so I have not seen anything."  Past Medical History:  Diagnosis Date   Migraines    Panic attacks      No past surgical history on file.  Prior to Admission medications   Medication Sig Start Date End Date Taking? Authorizing Provider  doxycycline (VIBRAMYCIN) 100 MG capsule Take 1 capsule (100 mg total) by mouth 2 (two) times daily for 10 days. 09/05/21 09/15/21 Yes Cesilia Shinn, [place], [place]    Allergies Klonopin [clonazepam] and Lithium  No family history on file.  Social History Social History   Tobacco Use   Smoking status: Every Day   Smokeless tobacco: Never  Substance Use Topics   Drug use: Yes    Types: Marijuana    Review of Systems  Constitutional: Negative for fever. Eyes: Negative for visual changes. ENT: Negative for sore throat. Neck: No neck pain  Cardiovascular: Negative for chest pain. Respiratory: Negative for shortness of breath. Gastrointestinal: Negative for abdominal pain, vomiting or diarrhea. Genitourinary: + dysuria. Musculoskeletal: Negative for back pain. Skin: Negative for rash. Neurological: Negative for headaches, weakness or numbness. Psych: No SI or HI  ____________________________________________   PHYSICAL EXAM:  VITAL SIGNS: ED Triage Vitals [09/04/21 2155]  Enc Vitals Group      BP (!) 154/91     Pulse Rate 99     Resp 16     Temp 98.5 F (36.9 C)     Temp Source Oral     SpO2 98 %     Weight 235 lb (106.6 kg)     Height 5\' 9"  (1.753 m)     Head Circumference      Peak Flow      Pain Score 10     Pain Loc      Pain Edu?      Excl. in GC?     Constitutional: Alert and oriented. Well appearing and in no apparent distress. HEENT:      Head: Normocephalic and atraumatic.         Eyes: Conjunctivae are normal. Sclera is non-icteric.       Mouth/Throat: Mucous membranes are moist.       Neck: Supple with no signs of meningismus. Cardiovascular: Regular rate and rhythm.  Respiratory: Normal respiratory effort.  Gastrointestinal: Soft, non tender, and non distended with positive bowel sounds. No rebound or guarding. Genitourinary: No CVA tenderness. Musculoskeletal:  No edema, cyanosis, or erythema of extremities. Neurologic: Normal speech and language. Face is symmetric. Moving all extremities. No gross focal neurologic deficits are appreciated. Skin: Skin is warm, dry and intact. No rash noted. Psychiatric: Mood and affect are normal. Speech and behavior are normal.  ____________________________________________   LABS (all labs ordered are listed, but only abnormal results are displayed)  Labs Reviewed  URINALYSIS, COMPLETE (UACMP) WITH MICROSCOPIC - Abnormal; Notable for the  following components:      Result Value   Hgb urine dipstick TRACE (*)    All other components within normal limits  CHLAMYDIA/NGC RT PCR ([facility] ONLY)            URINE CULTURE   ____________________________________________  EKG  none  ____________________________________________  RADIOLOGY    ____________________________________________   PROCEDURES  Procedure(s) performed: None Procedures Critical Care performed:  None ____________________________________________   INITIAL IMPRESSION / ASSESSMENT AND PLAN / ED COURSE  35 y.o. male who presents for  evaluation of dysuria. X 1 week.  No abdominal pain or tenderness.  No fever or tachycardia.  Normal GU exam.  UA negative for UTI.  GC chlamydia pending.  Patient treated for possible STD with IM Rocephin and 10-day course of doxycycline.  Discussed safe sex and full treatment of patient and partner prior to resume sexual activities.  Discussed my standard return precautions and follow-up with PCP     _____________________________________________ Please note:  Patient was evaluated in Emergency Department today for the symptoms described in the history of present illness. Patient was evaluated in the context of the global COVID-19 pandemic, which necessitated consideration that the patient might be at risk for infection with the SARS-CoV-2 virus that causes COVID-19. Institutional protocols and algorithms that pertain to the evaluation of patients at risk for COVID-19 are in a state of rapid change based on information released by regulatory bodies including the CDC and federal and state organizations. These policies and algorithms were followed during the patient's care in the ED.  Some ED evaluations and interventions may be delayed as a result of limited staffing during the pandemic.   [place] Controlled Substance Database was reviewed by me. ____________________________________________   FINAL CLINICAL IMPRESSION(S) / ED DIAGNOSES   Final diagnoses:  Dysuria  Concern about STD in male without diagnosis      NEW MEDICATIONS STARTED DURING THIS VISIT:  ED Discharge Orders          Ordered    doxycycline (VIBRAMYCIN) 100 MG capsule  2 times daily        09/05/21 0246             Note:  This document was prepared using Dragon voice recognition software and may include unintentional dictation errors.    Don Perking, [place], MD 09/05/21 [phone]

## 2024-01-31 ENCOUNTER — Emergency Department
Admission: EM | Admit: 2024-01-31 | Discharge: 2024-01-31 | Disposition: A | Discharge status: Home / Self Care | Payer: 59 | Attending: Emergency Medicine | Admitting: Emergency Medicine

## 2024-01-31 ENCOUNTER — Other Ambulatory Visit: Payer: Self-pay

## 2024-01-31 ENCOUNTER — Emergency Department: Payer: 59

## 2024-01-31 DIAGNOSIS — Z20822 Contact with and (suspected) exposure to covid-19: Secondary | ICD-10-CM | POA: Insufficient documentation

## 2024-01-31 DIAGNOSIS — R0602 Shortness of breath: Secondary | ICD-10-CM | POA: Diagnosis present

## 2024-01-31 DIAGNOSIS — J101 Influenza due to other identified influenza virus with other respiratory manifestations: Secondary | ICD-10-CM | POA: Insufficient documentation

## 2024-01-31 LAB — BASIC METABOLIC PANEL
Anion gap: 9 (ref 5–15)
BUN: 13 mg/dL (ref 6–20)
CO2: 21 mmol/L — ABNORMAL LOW (ref 22–32)
Calcium: 9.3 mg/dL (ref 8.9–10.3)
Chloride: 108 mmol/L (ref 98–111)
Creatinine, Ser: 1.18 mg/dL (ref 0.61–1.24)
GFR, Estimated: >60 mL/min (ref >60)
Glucose, Bld: 88 mg/dL (ref 70–99)
Potassium: 3.8 mmol/L (ref 3.5–5.1)
Sodium: 138 mmol/L (ref 135–145)

## 2024-01-31 LAB — CBC
HCT: 45.8 % (ref 39.0–52.0)
Hemoglobin: 15.3 g/dL (ref 13.0–17.0)
MCH: 27.4 pg (ref 26.0–34.0)
MCHC: 33.4 g/dL (ref 30.0–36.0)
MCV: 81.9 fL (ref 80.0–100.0)
Platelets: 233 10*3/uL (ref 150–400)
RBC: 5.59 MIL/uL (ref 4.22–5.81)
RDW: 14.4 % (ref 11.5–15.5)
WBC: 4.2 10*3/uL (ref 4.0–10.5)
nRBC: 0 % (ref 0.0–0.2)

## 2024-01-31 LAB — RESP PANEL BY RT-PCR (RSV, FLU A&B, COVID)  RVPGX2
Influenza A by PCR: POSITIVE — AB
Influenza B by PCR: NEGATIVE
Resp Syncytial Virus by PCR: NEGATIVE
SARS Coronavirus 2 by RT PCR: NEGATIVE

## 2024-01-31 LAB — TROPONIN I (HIGH SENSITIVITY): Troponin I (High Sensitivity): 6 ng/L (ref <18)

## 2024-01-31 LAB — GROUP A STREP BY PCR: Group A Strep by PCR: NOT DETECTED

## 2024-01-31 MED ORDER — OSELTAMIVIR PHOSPHATE 75 MG PO CAPS
75.0000 mg | ORAL_CAPSULE | Freq: Once | ORAL | Status: AC
Start: 2024-01-31 — End: 2024-01-31
  Administered 2024-01-31: 75 mg via ORAL
  Filled 2024-01-31: qty 1

## 2024-01-31 MED ORDER — OSELTAMIVIR PHOSPHATE 75 MG PO CAPS: 75.0000 mg | ORAL_CAPSULE | Freq: Two times a day (BID) | ORAL | 0 refills | Status: DC

## 2024-01-31 MED ORDER — IBUPROFEN 800 MG PO TABS
800.0000 mg | ORAL_TABLET | Freq: Once | ORAL | Status: AC
  Administered 2024-01-31: 800 mg via ORAL
  Filled 2024-01-31: qty 1

## 2024-01-31 MED ORDER — OSELTAMIVIR PHOSPHATE 75 MG PO CAPS: 75.0000 mg | ORAL_CAPSULE | Freq: Two times a day (BID) | ORAL | 0 refills | Status: AC

## 2024-01-31 MED ORDER — IBUPROFEN 800 MG PO TABS: 800.0000 mg | ORAL_TABLET | Freq: Three times a day (TID) | ORAL | 0 refills | Status: AC | PRN

## 2024-01-31 NOTE — ED Triage Notes (Signed)
Pt to ED for flu sx x1 week. Also reports has been having chest pain from coughing, denies cp at this time. Family here for same.

## 2024-01-31 NOTE — ED Provider Notes (Signed)
[facility] Provider Note    Event Date/Time   First MD Initiated Contact with Patient 01/31/24 1604     (approximate)   History   flu sx and Chest Pain   HPI  Dylan Cook is a 38 y.o. male who presents today with history of fever, in the last 4 days, shortness of breath, sore throat, liquid diarrhea x 3 decreased appetite, chest pain.  Patient reports sick contacts at home      Physical Exam   Triage Vital Signs: ED Triage Vitals [01/31/24 1506]  Encounter Vitals Group     BP (!) 131/111     Systolic BP Percentile      Diastolic BP Percentile      Pulse Rate 90     Resp 19     Temp 99.1 F (37.3 C)     Temp src      SpO2 97 %     Weight 240 lb (108.9 kg)     Height 5\' 10"  (1.778 m)     Head Circumference      Peak Flow      Pain Score 0     Pain Loc      Pain Education      Exclude from Growth Chart     Most recent vital signs: Vitals:   01/31/24 1506  BP: (!) 131/111  Pulse: 90  Resp: 19  Temp: 99.1 F (37.3 C)  SpO2: 97%     Constitutional: Alert, active cough, mild distress for sore throat Eyes: Conjunctivae are normal.  Head: Atraumatic. Nose: No congestion/rhinnorhea. Mouth/Throat: Mucous membranes are moist.  No peritonsillar erythema Neck: Painless ROM.  Cardiovascular:   Good peripheral circulation.  Regular rhythm Respiratory: Normal respiratory effort.  No retractions.  No wheezing no rales Gastrointestinal: Soft and nontender.  Musculoskeletal:  no deformity Neurologic:  MAE spontaneously. No gross focal neurologic deficits are appreciated.  Skin:  Skin is warm, dry and intact. No rash noted. Psychiatric: Mood and affect are normal. Speech and behavior are normal.    ED Results / Procedures / Treatments   Labs (all labs ordered are listed, but only abnormal results are displayed) Labs Reviewed  RESP PANEL BY RT-PCR (RSV, FLU A&B, COVID)  [id number] - Abnormal; Notable for the following components:       Result Value   Influenza A by PCR POSITIVE (*)    All other components within normal limits  BASIC METABOLIC PANEL - Abnormal; Notable for the following components:   CO2 21 (*)    All other components within normal limits  GROUP A STREP BY PCR  CBC  TROPONIN I (HIGH SENSITIVITY)  TROPONIN I (HIGH SENSITIVITY)     EKG See physician read    RADIOLOGY I independently reviewed and interpreted imaging and agree with radiologists findings.      PROCEDURES:  Critical Care performed:   Procedures   MEDICATIONS ORDERED IN ED: Medications  ibuprofen (ADVIL) tablet 800 mg (has no administration in time range)  oseltamivir (TAMIFLU) capsule 75 mg (has no administration in time range)     IMPRESSION / MDM / ASSESSMENT AND PLAN / ED COURSE  I reviewed the triage vital signs and the nursing notes.  Differential diagnosis includes, but is not limited to, influenza, COVID, RSV, pneumonia, MI,  Patient's presentation is most consistent with acute complicated illness / injury requiring diagnostic workup.   Patient's diagnosis is consistent with influenza A. I independently reviewed and interpreted  imaging and agree with radiologists findings. Labs are rea reassuring. I did review the patient's allergies and medications. Patient will be discharged home with prescriptions for Tamiflu, ibuprofen. Patient is to follow up with PCP as needed or otherwise directed. Patient is given ED precautions to return to the ED for any worsening or new symptoms. Discussed plan of care with patient, answered all of patient's questions, Patient agreeable to plan of care. Advised patient to take medications according to the instructions on the label. Discussed possible side effects of new medications. Patient verbalized understanding. Clinical Course as of 01/31/24 1653  Thu Jan 31, 2024  1605 ED EKG [AE]  1625 Resp panel by RT-PCR (RSV, Flu A&B, Covid) Anterior Nasal Swab(!) Influenza A positive [AE]   1625 Group A Strep by PCR ([facility] Only) Negative [AE]  1625 CBC Within normal limits [AE]  1626 Basic metabolic panel(!) Within normal limits [AE]  1626 Troponin I (High Sensitivity) Negative [AE]  1652 DG Chest 2 View Grossly no acute cardiopulmonary disease.   [AE]    Clinical Course User Index [AE] Gladys Damme, PA-C     FINAL CLINICAL IMPRESSION(S) / ED DIAGNOSES   Final diagnoses:  Influenza A     Rx / DC Orders   ED Discharge Orders          Ordered    oseltamivir (TAMIFLU) 75 MG capsule  2 times daily,   Status:  Discontinued        01/31/24 1629    ibuprofen (ADVIL) 800 MG tablet  Every 8 hours PRN        01/31/24 1629    oseltamivir (TAMIFLU) 75 MG capsule  2 times daily        01/31/24 1630             Note:  This document was prepared using Dragon voice recognition software and may include unintentional dictation errors.   Gladys Damme, PA-C 01/31/24 1657    Minna Antis, MD 01/31/24 2259

## 2024-01-31 NOTE — Discharge Instructions (Addendum)
Diagnosed with influenza A.  Please take Tamiflu to 1 capsule by mouth 2 times daily for 5 days.  Please take ibuprofen every 8 hours after main meals as needed for pain and fever.  Please drink plenty of fluids.  Please come back to ED or go to your PCP if you have new symptoms or symptoms worsen.  You can call for a PCP appointment with  [facility], [place].

## 2024-01-31 NOTE — ED Notes (Signed)
First Nurse Note: Pt to ED via [facility] from home for sudden onset chest pain 30 minutes ago. Pt has had flu like symptoms x 1 week. Pt given 2 sprays of nitroglycerin, 324 mg of Aspirin.

## 2024-02-14 ENCOUNTER — Ambulatory Visit: Payer: 59 | Admitting: Nurse Practitioner

## 2024-02-14 NOTE — Progress Notes (Deleted)
   There were no vitals taken for this visit.   Subjective:    Patient ID: Dylan Cook, male    DOB: 06/15/1986, 38 y.o.   MRN: [id number]  HPI: Dylan Cook is a 38 y.o. male  No chief complaint on file.  Patient presents to clinic to establish care with new PCP.  Introduced to Publishing rights manager role and practice setting.  All questions answered.  Discussed provider/patient relationship and expectations.  Patient reports a history of ***. Patient denies a history of: Hypertension, Elevated Cholesterol, Diabetes, Thyroid problems, Depression, Anxiety, Neurological problems, and Abdominal problems.   Active Ambulatory Problems    Diagnosis Date Noted   No Active Ambulatory Problems   Resolved Ambulatory Problems    Diagnosis Date Noted   No Resolved Ambulatory Problems   Past Medical History:  Diagnosis Date   Migraines    Panic attacks    No past surgical history on file. No family history on file.   Review of Systems  Per HPI unless specifically indicated above     Objective:    There were no vitals taken for this visit.  Wt Readings from Last 3 Encounters:  01/31/24 240 lb (108.9 kg)  09/04/21 235 lb (106.6 kg)  05/15/18 230 lb (104.3 kg)    Physical Exam  Results for orders placed or performed during the hospital encounter of 01/31/24  Group A Strep by PCR ([facility] Only)   Collection Time: 01/31/24  3:08 PM   Specimen: Throat; Sterile Swab  Result Value Ref Range   Group A Strep by PCR NOT DETECTED NOT DETECTED  Resp panel by RT-PCR (RSV, Flu A&B, Covid) Anterior Nasal Swab   Collection Time: 01/31/24  3:08 PM   Specimen: Anterior Nasal Swab  Result Value Ref Range   SARS Coronavirus 2 by RT PCR NEGATIVE NEGATIVE   Influenza A by PCR POSITIVE (A) NEGATIVE   Influenza B by PCR NEGATIVE NEGATIVE   Resp Syncytial Virus by PCR NEGATIVE NEGATIVE  Basic metabolic panel   Collection Time: 01/31/24  3:08 PM  Result Value Ref Range   Sodium 138 135  - 145 mmol/L   Potassium 3.8 3.5 - 5.1 mmol/L   Chloride 108 98 - 111 mmol/L   CO2 21 (L) 22 - 32 mmol/L   Glucose, Bld 88 70 - 99 mg/dL   BUN 13 6 - 20 mg/dL   Creatinine, Ser 4.09 0.61 - 1.24 mg/dL   Calcium 9.3 8.9 - 81.1 mg/dL   GFR, Estimated >91 >47 mL/min   Anion gap 9 5 - 15  CBC   Collection Time: 01/31/24  3:08 PM  Result Value Ref Range   WBC 4.2 4.0 - 10.5 K/uL   RBC 5.59 4.22 - 5.81 MIL/uL   Hemoglobin 15.3 13.0 - 17.0 g/dL   HCT 82.9 56.2 - 13.0 %   MCV 81.9 80.0 - 100.0 fL   MCH 27.4 26.0 - 34.0 pg   MCHC 33.4 30.0 - 36.0 g/dL   RDW 86.5 78.4 - 69.6 %   Platelets 233 150 - 400 K/uL   nRBC 0.0 0.0 - 0.2 %  Troponin I (High Sensitivity)   Collection Time: 01/31/24  3:08 PM  Result Value Ref Range   Troponin I (High Sensitivity) 6 <18 ng/L      Assessment & Plan:   Problem List Items Addressed This Visit   None    Follow up plan: No follow-ups on file.
